# Patient Record
Sex: Male | Born: 1995 | Race: Black or African American | Hispanic: No | Marital: Single | Smoking: Never smoker
Health system: Southern US, Community
[De-identification: ages and names within clinical notes are randomized; demographics above are authoritative.]

## PROBLEM LIST (undated history)

## (undated) DIAGNOSIS — L309 Dermatitis, unspecified: Secondary | ICD-10-CM

## (undated) HISTORY — PX: TONSILLECTOMY: SUR1361

## (undated) HISTORY — PX: OTHER SURGICAL HISTORY: SHX169

## (undated) HISTORY — PX: ANKLE SURGERY: SHX546

## (undated) HISTORY — PX: KNEE SURGERY: SHX244

---
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
## 2014-03-01 ENCOUNTER — Encounter: Payer: Self-pay | Admitting: Family Medicine

## 2015-04-22 ENCOUNTER — Emergency Department (HOSPITAL_COMMUNITY)
Admission: EM | Admit: 2015-04-22 | Discharge: 2015-04-23 | Disposition: A | Discharge status: Home / Self Care | Payer: 59 | Attending: Emergency Medicine | Admitting: Emergency Medicine

## 2015-04-22 ENCOUNTER — Encounter (HOSPITAL_COMMUNITY): Payer: Self-pay | Admitting: *Deleted

## 2015-04-22 DIAGNOSIS — K529 Noninfective gastroenteritis and colitis, unspecified: Secondary | ICD-10-CM | POA: Insufficient documentation

## 2015-04-22 DIAGNOSIS — Z872 Personal history of diseases of the skin and subcutaneous tissue: Secondary | ICD-10-CM | POA: Insufficient documentation

## 2015-04-22 DIAGNOSIS — R112 Nausea with vomiting, unspecified: Secondary | ICD-10-CM | POA: Diagnosis present

## 2015-04-22 HISTORY — DX: Dermatitis, unspecified: L30.9

## 2015-04-22 MED ORDER — ONDANSETRON HCL 4 MG/2ML IJ SOLN
4.0000 mg | Freq: Once | INTRAMUSCULAR | Status: AC
  Administered 2015-04-22: 4 mg via INTRAVENOUS
  Filled 2015-04-22: qty 2

## 2015-04-22 MED ORDER — SODIUM CHLORIDE 0.9 % IV BOLUS (SEPSIS)
1000.0000 mL | Freq: Once | INTRAVENOUS | Status: AC
  Administered 2015-04-22: 1000 mL via INTRAVENOUS

## 2015-04-22 NOTE — ED Notes (Signed)
Pt c/o n/v/d since 12 am last night

## 2015-04-23 LAB — CBC WITH DIFFERENTIAL/PLATELET
BASOS ABS: 0 10*3/uL (ref 0.0–0.1)
BASOS PCT: 0 %
EOS ABS: 0.1 10*3/uL (ref 0.0–0.7)
EOS PCT: 1 %
HCT: 45.2 % (ref 39.0–52.0)
Hemoglobin: 15.4 g/dL (ref 13.0–17.0)
Lymphocytes Relative: 16 %
Lymphs Abs: 1.6 10*3/uL (ref 0.7–4.0)
MCH: 28.1 pg (ref 26.0–34.0)
MCHC: 34.1 g/dL (ref 30.0–36.0)
MCV: 82.3 fL (ref 78.0–100.0)
MONO ABS: 1.5 10*3/uL — AB (ref 0.1–1.0)
MONOS PCT: 15 %
NEUTROS ABS: 6.9 10*3/uL (ref 1.7–7.7)
Neutrophils Relative %: 68 %
PLATELETS: 282 10*3/uL (ref 150–400)
RBC: 5.49 MIL/uL (ref 4.22–5.81)
RDW: 13.2 % (ref 11.5–15.5)
WBC: 10 10*3/uL (ref 4.0–10.5)

## 2015-04-23 LAB — COMPREHENSIVE METABOLIC PANEL
ALBUMIN: 3.7 g/dL (ref 3.5–5.0)
ALT: 27 U/L (ref 17–63)
ANION GAP: 8 (ref 5–15)
AST: 24 U/L (ref 15–41)
Alkaline Phosphatase: 53 U/L (ref 38–126)
BUN: 13 mg/dL (ref 6–20)
CHLORIDE: 105 mmol/L (ref 101–111)
CO2: 25 mmol/L (ref 22–32)
Calcium: 7.8 mg/dL — ABNORMAL LOW (ref 8.9–10.3)
Creatinine, Ser: 0.52 mg/dL — ABNORMAL LOW (ref 0.61–1.24)
GFR calc Af Amer: >60 mL/min (ref >60)
GFR calc non Af Amer: >60 mL/min (ref >60)
GLUCOSE: 104 mg/dL — AB (ref 65–99)
POTASSIUM: 3.4 mmol/L — AB (ref 3.5–5.1)
SODIUM: 138 mmol/L (ref 135–145)
TOTAL PROTEIN: 6.5 g/dL (ref 6.5–8.1)
Total Bilirubin: 0.8 mg/dL (ref 0.3–1.2)

## 2015-04-23 LAB — URINALYSIS, ROUTINE W REFLEX MICROSCOPIC
BILIRUBIN URINE: NEGATIVE
Glucose, UA: NEGATIVE mg/dL
Hgb urine dipstick: NEGATIVE
KETONES UR: NEGATIVE mg/dL
LEUKOCYTES UA: NEGATIVE
NITRITE: NEGATIVE
PH: 6 (ref 5.0–8.0)
Protein, ur: NEGATIVE mg/dL
SPECIFIC GRAVITY, URINE: 1.025 (ref 1.005–1.030)
UROBILINOGEN UA: 0.2 mg/dL (ref 0.0–1.0)

## 2015-04-23 LAB — LIPASE, BLOOD: LIPASE: 17 U/L (ref 11–51)

## 2015-04-23 MED ORDER — DIPHENOXYLATE-ATROPINE 2.5-0.025 MG PO TABS: 1.0000 | ORAL_TABLET | Freq: Four times a day (QID) | ORAL | Status: DC | PRN

## 2015-04-23 MED ORDER — ONDANSETRON 8 MG PO TBDP: 8.0000 mg | ORAL_TABLET | Freq: Three times a day (TID) | ORAL | Status: DC | PRN

## 2015-04-23 MED ORDER — DIPHENOXYLATE-ATROPINE 2.5-0.025 MG PO TABS: 2.0000 | ORAL_TABLET | Freq: Once | ORAL | Status: DC

## 2015-04-23 NOTE — ED Provider Notes (Signed)
CSN: [id number]     Arrival date & time 04/22/15  2149 History   First MD Initiated Contact with Patient 04/22/15 2222     Chief Complaint  Patient presents with  . Emesis     (Consider location/radiation/quality/duration/timing/severity/associated sxs/prior Treatment) The history is provided by the patient and a parent.   Mario Owens is a 19 y.o. male presenting with a 24 hour history of nausea, vomiting and diarrhea.  He [place] out in [place] last [place], ate fish and chips which tasted fine, but within 4 hours had developed nausea with one episode of emesis, followed by persistent diarrhea throughout today.  He reports nonbloody diarrhea every 2-3 hours and persistent nausea without emesis.  He feels fatigued, denies dizziness or weakness and has been able to maintain oral hydration frequent sips of gator aid.  His last urination was just prior to arrival. He denies abdominal pain, but does have cramping just prior to bm's, resolves afterward.  He has taken an otc equate brand multisymptom n/v/d product 3 hours before arrival and has had no diarrhea since.  His mother at the bedside ate the same restaurant but did not eat the same food.  She denies current symptoms but did have diarrhea which lasted for a day 5 days ago.     Past Medical History  Diagnosis Date  . Eczema    Past Surgical History  Procedure Laterality Date  . Tonsillectomy    . Knee surgery    . Ankle surgery    . Bone correction     History reviewed. No pertinent family history. Social History  Substance Use Topics  . Smoking status: Never Smoker   . Smokeless tobacco: None  . Alcohol Use: No    Review of Systems  Constitutional: Negative for fever.  HENT: Negative.   Eyes: Negative.   Respiratory: Negative for chest tightness and shortness of breath.   Cardiovascular: Negative for chest pain.  Gastrointestinal: Positive for nausea, vomiting and diarrhea. Negative for abdominal pain and blood  in stool.  Genitourinary: Negative.  Negative for dysuria.  Musculoskeletal: Negative for joint swelling, arthralgias and neck pain.  Skin: Negative.  Negative for rash and wound.  Neurological: Negative for dizziness, weakness, light-headedness, numbness and headaches.  Psychiatric/Behavioral: Negative.       Allergies  Review of patient's allergies indicates no known allergies.  Home Medications   Prior to Admission medications   Medication Sig Start Date End Date Taking? Authorizing Provider  ibuprofen (ADVIL,MOTRIN) 200 MG tablet Take 400 mg by mouth every 6 (six) hours as needed for mild pain.   Yes Historical Provider, MD  diphenoxylate-atropine (LOMOTIL) 2.5-0.025 MG tablet Take 1 tablet by mouth 4 (four) times daily as needed for diarrhea or loose stools. 04/23/15   Burgess AmorJulie Sherhonda Gaspar, PA-C  ondansetron (ZOFRAN ODT) 8 MG disintegrating tablet Take 1 tablet (8 mg total) by mouth every 8 (eight) hours as needed for nausea or vomiting. 04/23/15   Burgess AmorJulie Rollo Farquhar, PA-C   BP 121/85 mmHg  Pulse 140  Temp(Src) 98.5 F (36.9 C) (Oral)  Resp 20  Ht 4\' 5"  (1.346 m)  Wt 221 lb 8 oz (100.472 kg)  BMI 55.46 kg/m2  SpO2 98% Physical Exam  Constitutional: He appears well-developed and well-nourished. No distress.  Pulse noted on initial vitals.  Pulse rate 84 during exam.  HENT:  Head: Normocephalic and atraumatic.  Eyes: Conjunctivae are normal.  Neck: Normal range of motion.  Cardiovascular: Normal rate, regular rhythm, normal  heart sounds and intact distal pulses.   Pulmonary/Chest: Effort normal and breath sounds normal. He has no wheezes.  Abdominal: Soft. He exhibits no distension. Bowel sounds are increased. There is no tenderness. There is no rebound and no guarding.  Musculoskeletal: Normal range of motion.  Neurological: He is alert.  Skin: Skin is warm and dry. He is not diaphoretic.  Psychiatric: He has a normal mood and affect.  Nursing note and vitals reviewed.   ED Course   Procedures (including critical care time) Labs Review Labs Reviewed  CBC WITH DIFFERENTIAL/PLATELET - Abnormal; Notable for the following:    Monocytes Absolute 1.5 (*)    All other components within normal limits  COMPREHENSIVE METABOLIC PANEL - Abnormal; Notable for the following:    Potassium 3.4 (*)    Glucose, Bld 104 (*)    Creatinine, Ser 0.52 (*)    Calcium 7.8 (*)    All other components within normal limits  LIPASE, BLOOD  URINALYSIS, ROUTINE W REFLEX MICROSCOPIC (NOT AT [facility])    Imaging Review No results found. I have personally reviewed and evaluated these images and lab results as part of my medical decision-making.   EKG Interpretation None      MDM   Final diagnoses:  Gastroenteritis    Pt was given IV fluids, zofran.  He had no vomiting, no diarrhea during ed visit.  Nausea resolved.  Labs reviewed and stable.  Suspect viral gastroenteritis given mother sx earlier this week, less likely food borne.  Pt was given prescription for zofran, lomotil - but advised to only use if diarrhea persists as could cause constipation otherwise. Rest, fluids, b.r.a.t. Diet discussed.  felt stable for dc home, advised recheck by pcp or return here for any persist or worsening sx.  Pt produced paper that needs to be filled out to get his plane fare refunded.  Was supposed to fly to [facility] tomorrow for vacation.  Form completed for him.    Burgess Amor, PA-C 04/23/15 1304  Layla Maw Ward, DO 04/23/15 2301

## 2017-12-19 ENCOUNTER — Ambulatory Visit (INDEPENDENT_AMBULATORY_CARE_PROVIDER_SITE_OTHER): Payer: BLUE CROSS/BLUE SHIELD | Admitting: Family Medicine

## 2017-12-19 ENCOUNTER — Encounter: Payer: Self-pay | Admitting: Family Medicine

## 2017-12-19 VITALS — Temp 98.1°F | Wt 222.0 lb

## 2017-12-19 DIAGNOSIS — J329 Chronic sinusitis, unspecified: Secondary | ICD-10-CM

## 2017-12-19 DIAGNOSIS — J310 Chronic rhinitis: Secondary | ICD-10-CM

## 2017-12-19 MED ORDER — CEFDINIR 300 MG PO CAPS: 300.0000 mg | ORAL_CAPSULE | Freq: Two times a day (BID) | ORAL | 0 refills | Status: DC

## 2017-12-19 NOTE — Progress Notes (Signed)
   Subjective:    Patient ID: Mario Owens, male    DOB: 1995-12-17, 22 y.o.   MRN: [id number]  Cough  This is a new problem. The current episode started in the past 7 days. Associated symptoms include ear pain, a fever, headaches, nasal congestion and a sore throat. Treatments tried: tylenol cold and sinus.   Took tylenol cold   Started Sunday allergy like symtoms  Grad got wore as the wek went on   Felt warn had fver   Pos nasa l disch gunky some   Review of Systems  Constitutional: Positive for fever.  HENT: Positive for ear pain and sore throat.   Respiratory: Positive for cough.   Neurological: Positive for headaches.       Objective:   Physical Exam  Alert, mild malaise. Hydration good Vitals stable. frontal/ maxillary tenderness evident positive nasal congestion. pharynx normal neck supple  lungs clear/no crackles or wheezes. heart regular in rhythm       Assessment & Plan:  Impression rhinosinusitis likely post viral, discussed with patient. plan antibiotics prescribed. Questions answered. Symptomatic care discussed. warning signs discussed. WSL

## 2018-08-26 ENCOUNTER — Ambulatory Visit (INDEPENDENT_AMBULATORY_CARE_PROVIDER_SITE_OTHER): Payer: BLUE CROSS/BLUE SHIELD | Admitting: Family Medicine

## 2018-08-26 ENCOUNTER — Encounter: Payer: Self-pay | Admitting: Family Medicine

## 2018-08-26 VITALS — BP 128/84 | Ht <= 58 in | Wt 244.4 lb

## 2018-08-26 DIAGNOSIS — Z13220 Encounter for screening for lipoid disorders: Secondary | ICD-10-CM | POA: Diagnosis not present

## 2018-08-26 DIAGNOSIS — M419 Scoliosis, unspecified: Secondary | ICD-10-CM

## 2018-08-26 DIAGNOSIS — Z23 Encounter for immunization: Secondary | ICD-10-CM

## 2018-08-26 DIAGNOSIS — Z131 Encounter for screening for diabetes mellitus: Secondary | ICD-10-CM | POA: Diagnosis not present

## 2018-08-26 DIAGNOSIS — R635 Abnormal weight gain: Secondary | ICD-10-CM

## 2018-08-26 DIAGNOSIS — Z0000 Encounter for general adult medical examination without abnormal findings: Secondary | ICD-10-CM

## 2018-08-26 NOTE — Addendum Note (Signed)
Addended by: Margaretha Sheffield on: 08/26/2018 09:38 AM   Modules accepted: Orders

## 2018-08-26 NOTE — Progress Notes (Signed)
Subjective:    Patient ID: Mario Owens, male    DOB: 1995/07/14, 22 y.o.   MRN: [id number]  HPI  The patient comes in today for a wellness visit.  Works with [employer], at home, tax support   wnting to get more active and exercise more, does not eat bad, but tends to drink sodas, now trying to drink more water     A review of their health history was completed.  A review of medications was also completed.  Any needed refills; none  Eating habits: trying to eat healthy  Falls/  MVA accidents in past few months: none  Regular exercise: try- esp recently  Specialist pt sees on regular basis: no  Preventative health issues were discussed.   Additional concerns: getting reestablished with ortho for back issues, has not had any major pain. But was told in the past should see an ortho long term   Review of Systems  Constitutional: Negative for activity change, appetite change and fever.  HENT: Negative for congestion and rhinorrhea.   Eyes: Negative for discharge.  Respiratory: Negative for cough and wheezing.   Cardiovascular: Negative for chest pain.  Gastrointestinal: Negative for abdominal pain, blood in stool and vomiting.  Genitourinary: Negative for difficulty urinating and frequency.  Musculoskeletal: Negative for neck pain.  Skin: Negative for rash.  Allergic/Immunologic: Negative for environmental allergies and food allergies.  Neurological: Negative for weakness and headaches.  Psychiatric/Behavioral: Negative for agitation.  All other systems reviewed and are negative.      Objective:   Physical Exam Vitals signs reviewed.  Constitutional:      Appearance: He is well-developed.     Comments: Patient shows classic morphologic features of dwarfism  Considerable obesity also present with BMI of 60  HENT:     Head: Normocephalic and atraumatic.     Right Ear: External ear normal.     Left Ear: External ear normal.     Nose: Nose normal.  Eyes:   Pupils: Pupils are equal, round, and reactive to light.  Neck:     Musculoskeletal: Normal range of motion and neck supple.     Thyroid: No thyromegaly.  Cardiovascular:     Rate and Rhythm: Normal rate and regular rhythm.     Heart sounds: Normal heart sounds. No murmur.  Pulmonary:     Effort: Pulmonary effort is normal. No respiratory distress.     Breath sounds: Normal breath sounds. No wheezing.  Abdominal:     General: Bowel sounds are normal. There is no distension.     Palpations: Abdomen is soft. There is no mass.     Tenderness: There is no abdominal tenderness.  Genitourinary:    Penis: Normal.   Musculoskeletal: Normal range of motion.     Comments: Substantial scoliosis present  Lymphadenopathy:     Cervical: No cervical adenopathy.  Skin:    General: Skin is warm and dry.     Findings: No erythema.  Neurological:     Mental Status: He is alert.     Motor: No abnormal muscle tone.  Psychiatric:        Behavior: Behavior normal.        Judgment: Judgment normal.           Assessment & Plan:  Impression wellness exam.  Diet discussed.  Exercise discussed.  Vaccines discussed and administered.  Appropriate blood work.  2.  Scoliosis with history of dwarfism.  Has been followed by orthopedist in the  past.  Wishes to continue to see 1 we will work on this referral  3.  Morbid obesity.  Long discussion held in this regard.  Patient needs to start exercising regularly.  Stop sugary drinks.  Strong family history of diabetes  Follow-up yearly

## 2018-08-27 LAB — BASIC METABOLIC PANEL
BUN / CREAT RATIO: 25 — AB (ref 9–20)
BUN: 16 mg/dL (ref 6–20)
CO2: 24 mmol/L (ref 20–29)
CREATININE: 0.64 mg/dL — AB (ref 0.76–1.27)
Calcium: 9.2 mg/dL (ref 8.7–10.2)
Chloride: 98 mmol/L (ref 96–106)
GFR calc Af Amer: 158 mL/min/{1.73_m2} (ref >59)
GFR calc non Af Amer: 137 mL/min/{1.73_m2} (ref >59)
GLUCOSE: 95 mg/dL (ref 65–99)
Potassium: 4.4 mmol/L (ref 3.5–5.2)
SODIUM: 138 mmol/L (ref 134–144)

## 2018-08-27 LAB — HEPATIC FUNCTION PANEL
ALK PHOS: 73 IU/L (ref 39–117)
ALT: 30 IU/L (ref 0–44)
AST: 25 IU/L (ref 0–40)
Albumin: 4.8 g/dL (ref 4.1–5.2)
BILIRUBIN, DIRECT: 0.13 mg/dL (ref 0.00–0.40)
Bilirubin Total: 0.4 mg/dL (ref 0.0–1.2)
TOTAL PROTEIN: 7.1 g/dL (ref 6.0–8.5)

## 2018-08-27 LAB — LIPID PANEL
Chol/HDL Ratio: 3.7 ratio (ref 0.0–5.0)
Cholesterol, Total: 171 mg/dL (ref 100–199)
HDL: 46 mg/dL (ref >39)
LDL Calculated: 111 mg/dL — ABNORMAL HIGH (ref 0–99)
TRIGLYCERIDES: 72 mg/dL (ref 0–149)
VLDL Cholesterol Cal: 14 mg/dL (ref 5–40)

## 2018-09-02 ENCOUNTER — Encounter: Payer: Self-pay | Admitting: Family Medicine

## 2018-09-04 ENCOUNTER — Encounter: Payer: Self-pay | Admitting: Family Medicine

## 2018-09-06 ENCOUNTER — Encounter: Payer: Self-pay | Admitting: Family Medicine

## 2019-05-14 ENCOUNTER — Other Ambulatory Visit: Payer: Self-pay

## 2019-05-14 ENCOUNTER — Ambulatory Visit (INDEPENDENT_AMBULATORY_CARE_PROVIDER_SITE_OTHER): Payer: BC Managed Care – PPO | Admitting: Family Medicine

## 2019-05-14 VITALS — Ht <= 58 in | Wt 250.0 lb

## 2019-05-14 DIAGNOSIS — Z8669 Personal history of other diseases of the nervous system and sense organs: Secondary | ICD-10-CM | POA: Diagnosis not present

## 2019-05-14 DIAGNOSIS — R5383 Other fatigue: Secondary | ICD-10-CM | POA: Diagnosis not present

## 2019-05-14 NOTE — Progress Notes (Signed)
   Subjective:  Audio only  Patient ID: Mario Owens, male    DOB: 05-Aug-1995, 23 y.o.   MRN: [id number]  HPI pt states he had a cpap when he was younger but it broke and he never got around to get a new one. Has been our for awhile. Would like to get a sleep study done.   Virtual Visit via Telephone Note  I connected with Alesandro Stueve Tep on 05/14/19 at 11:00 AM EST by telephone and verified that I am speaking with the correct person using two identifiers.  Location: Patient: home Provider: office   I discussed the limitations, risks, security and privacy concerns of performing an evaluation and management service by telephone and the availability of in person appointments. I also discussed with the patient that there may be a patient responsible charge related to this service. The patient expressed understanding and agreed to proceed.   History of Present Illness:    Observations/Objective:   Assessment and Plan:   Follow Up Instructions:    I discussed the assessment and treatment plan with the patient. The patient was provided an opportunity to ask questions and all were answered. The patient agreed with the plan and demonstrated an understanding of the instructions.   The patient was advised to call back or seek an in-person evaluation if the symptoms worsen or if the condition fails to improve as anticipated.  I provide 20 minutes of non-face-to-face time during this encounter.   Patient gives a history of confirmed sleep apnea in the past.  He has a very high BMI of 62.  Utilized CPAP but then got away from it.  After it broke  Notes substantial daytime fatigue and tiredness.  Patient does not drive.  Has major difficulty with snoring   Review of Systems No headache, no major weight loss or weight gain, no chest pain no back pain abdominal pain no change in bowel habits complete ROS otherwise negative     Objective:   Physical Exam   Virtual      Assessment & Plan:  Impression sleep apnea.  We need to do another sleep study to confirm patient's diagnosis and [place]  We will proceed.  I anticipate he will have true sleep apnea and will need a CPAP device

## 2019-05-16 ENCOUNTER — Encounter: Payer: Self-pay | Admitting: Family Medicine

## 2019-06-20 ENCOUNTER — Other Ambulatory Visit: Payer: Self-pay

## 2019-06-20 ENCOUNTER — Ambulatory Visit: Payer: BC Managed Care – PPO | Attending: Family Medicine | Admitting: Neurology

## 2019-06-20 DIAGNOSIS — Z8669 Personal history of other diseases of the nervous system and sense organs: Secondary | ICD-10-CM

## 2019-06-20 DIAGNOSIS — G4730 Sleep apnea, unspecified: Secondary | ICD-10-CM | POA: Diagnosis not present

## 2019-06-20 DIAGNOSIS — G4733 Obstructive sleep apnea (adult) (pediatric): Secondary | ICD-10-CM | POA: Diagnosis not present

## 2019-07-24 NOTE — Procedures (Signed)
  [facility] Krystyn Picking A. Gerilyn Pilgrim, MD     [email or web address]             HOME SLEEP TEST   LOCATION: [facility]   Patient Name: Mario Owens, Mario Owens Date: 06/20/2019 Gender: Male D.O.B: 09/22/1995 Age (years): 23 Referring Provider: Lacretia Nicks. Simone Curia Height (inches): 53 Interpreting Physician: Beryle Beams MD, ABSM Weight (lbs): 250 RPSGT: Alfonso Ellis BMI: 63 MRN: [id number] Neck Size: CLINICAL INFORMATION Sleep Study Type: HST     Indication for sleep study: N/A     Epworth Sleepiness Score: NA  SLEEP STUDY TECHNIQUE A multi-channel overnight portable sleep study was performed. The channels recorded were: nasal airflow, thoracic respiratory movement, and oxygen saturation with a pulse oximetry. Snoring was also monitored.  MEDICATIONS Patient self administered medications include: N/A.  Current Outpatient Medications:  .  ibuprofen (ADVIL,MOTRIN) 200 MG tablet, Take 400 mg by mouth every 6 (six) hours as needed for mild pain., Disp: , Rfl:    SLEEP ARCHITECTURE Patient was studied for 448.4 minutes. The sleep efficiency was 93.4 % and the patient was supine for 97.7%. The arousal index was 0.0 per hour.  RESPIRATORY PARAMETERS The overall AHI was 73.5 per hour, with a central apnea index of 1.3 per hour.  The oxygen nadir was 37% during sleep.     CARDIAC DATA Mean heart rate during sleep was 110.9 bpm.  IMPRESSIONS Severe obstructive sleep apnea occurred during this study (AHI = 73.5/h). AutoPAP 10-20 is recommended.   Argie Ramming, MD Diplomate, American Board of Sleep Medicine. ELECTRONICALLY SIGNED ON:  07/24/2019, 12:19 PM [facility] PH: [phone]   FX: [phone] ACCREDITED BY THE AMERICAN ACADEMY OF SLEEP MEDICINE

## 2019-07-28 ENCOUNTER — Encounter: Payer: Self-pay | Admitting: Family Medicine

## 2019-07-29 ENCOUNTER — Encounter: Payer: Self-pay | Admitting: Family Medicine

## 2019-08-18 ENCOUNTER — Telehealth: Payer: Self-pay | Admitting: Family Medicine

## 2019-08-18 NOTE — Telephone Encounter (Signed)
Please advise. Thank you

## 2019-08-18 NOTE — Telephone Encounter (Signed)
Pt contacted and verbalized understanding. Script for CPAP machine tubing mask and supplies awaiting signature then will fax to [employer].

## 2019-08-18 NOTE — Telephone Encounter (Signed)
Pt had sleep study done and is checking on results.

## 2019-08-18 NOTE — Telephone Encounter (Signed)
Ok let pt know specialist did not send [place] results, sorry re delay  Test showed very sig sleep apnea. 75 spells per hr   Amything over 18 per hr needs treatment   Cpap , auto titrate, 10-20 pressure settings, mask and tubing

## 2019-09-11 ENCOUNTER — Ambulatory Visit: Payer: BC Managed Care – PPO | Attending: Internal Medicine

## 2019-09-11 DIAGNOSIS — Z23 Encounter for immunization: Secondary | ICD-10-CM

## 2019-09-11 NOTE — Progress Notes (Signed)
   Covid-19 Vaccination Clinic  Name:  LEMARCUS BAGGERLY    MRN: [id number] DOB: 02-22-1996  09/11/2019  Mr. Kosa was observed post Covid-19 immunization for 15 minutes without incident. He was provided with Vaccine Information Sheet and instruction to access the V-Safe system.   Mr. Fluegel was instructed to call 911 with any severe reactions post vaccine: Marland Kitchen Difficulty breathing  . Swelling of face and throat  . A fast heartbeat  . A bad rash all over body  . Dizziness and weakness   Immunizations Administered    Name Date Dose VIS Date Route   Moderna COVID-19 Vaccine 09/11/2019  9:59 AM 0.5 mL 06/01/2019 Intramuscular   Manufacturer: Moderna   Lot: [id number]   NDC: 35456-256-38

## 2019-10-09 ENCOUNTER — Ambulatory Visit: Payer: BC Managed Care – PPO | Attending: Internal Medicine

## 2019-10-09 DIAGNOSIS — Z23 Encounter for immunization: Secondary | ICD-10-CM

## 2019-10-09 NOTE — Progress Notes (Signed)
   Covid-19 Vaccination Clinic  Name:  Mario Owens    MRN: [id number] DOB: 02-27-1996  10/09/2019  [name] was observed post Covid-19 immunization for 15 minutes without incident. He was provided with Vaccine Information Sheet and instruction to access the V-Safe system.   [name] was instructed to call 911 with any severe reactions post vaccine: Marland Kitchen Difficulty breathing  . Swelling of face and throat  . A fast heartbeat  . A bad rash all over body  . Dizziness and weakness   Immunizations Administered    Name Date Dose VIS Date Route   Moderna COVID-19 Vaccine 10/09/2019  9:35 AM 0.5 mL 06/01/2019 Intramuscular   Manufacturer: Moderna   Lot: [id number]   NDC: 03524-818-59

## 2019-10-13 ENCOUNTER — Ambulatory Visit: Payer: BC Managed Care – PPO

## 2020-03-12 ENCOUNTER — Telehealth: Payer: Self-pay | Admitting: Family Medicine

## 2020-03-12 NOTE — Telephone Encounter (Signed)
Front staff I filled out the death certificate of this patient Please fill out the administrative portion at the bottom Then please forward to the health department in the stamped envelope provided thank you

## 2020-03-31 DEATH — deceased
